# Patient Record
Sex: Female | Born: 2000 | Race: Black or African American | Hispanic: No | Marital: Single | Smoking: Never smoker
Health system: Southern US, Community
[De-identification: ages and names within clinical notes are randomized; demographics above are authoritative.]

## PROBLEM LIST (undated history)

## (undated) DIAGNOSIS — J45909 Unspecified asthma, uncomplicated: Secondary | ICD-10-CM

## (undated) HISTORY — PX: CARDIAC SURGERY: SHX584

---
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
## 2018-01-15 IMAGING — CT CT HEAD W/O CM
3 series · 14 of 47 positions shown, 16 images · non-contrast
Comparison: None.

CLINICAL DATA: Two-month history of headache. Two episodes of
recent syncope

EXAM:
CT HEAD WITHOUT CONTRAST
TECHNIQUE: Contiguous axial images were obtained from the base of the skull
through the vertex without intravenous contrast.

[Series 3: head wo 2's for pacs st · axial · 0.43mm/px · z∈[-150,-30]mm · 8 of 70 slices shown, 10 images]
[im 5/70  brain]
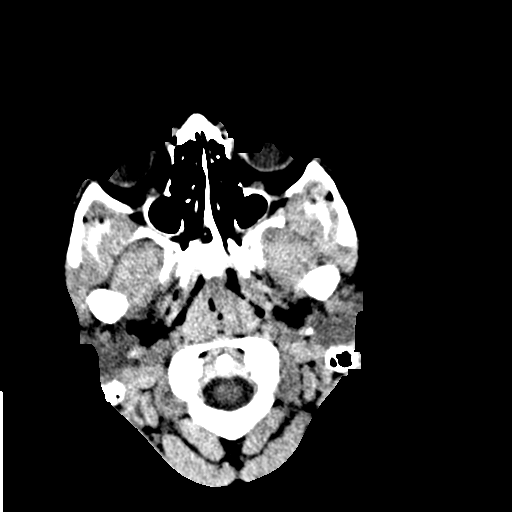
[im 5/70  bone]
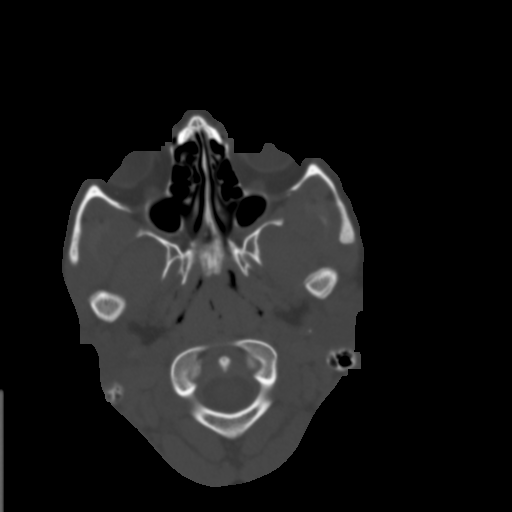
[im 15/70  brain]
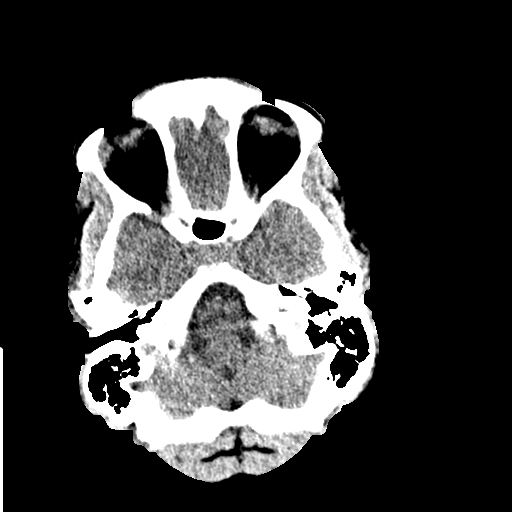
[im 22/70  brain]
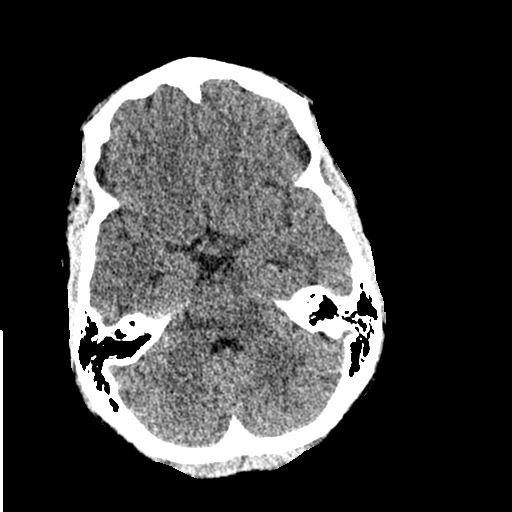
[im 31/70  brain]
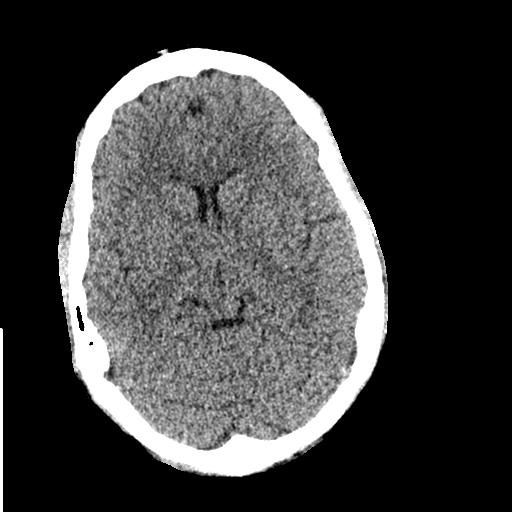
[im 39/70  brain]
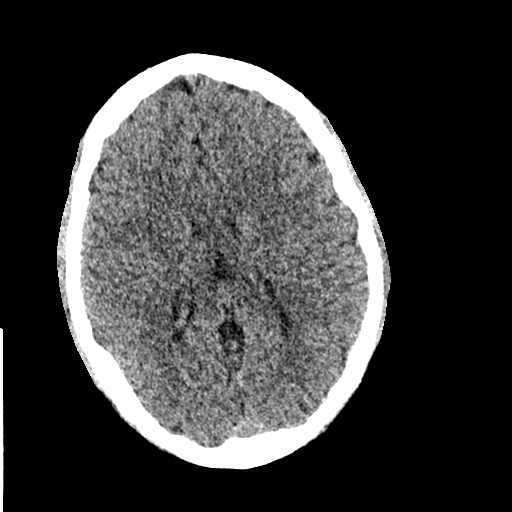
[im 39/70  bone]
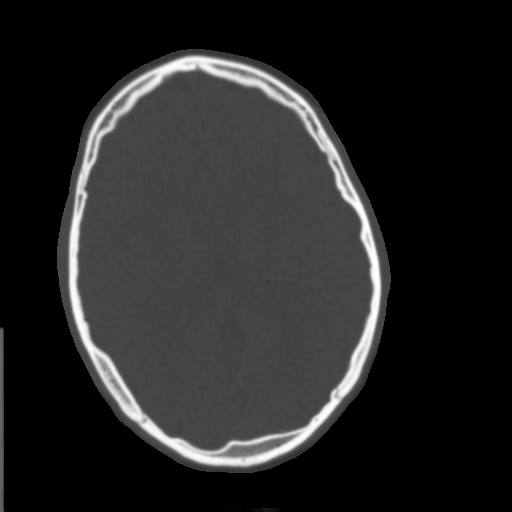
[im 48/70  brain]
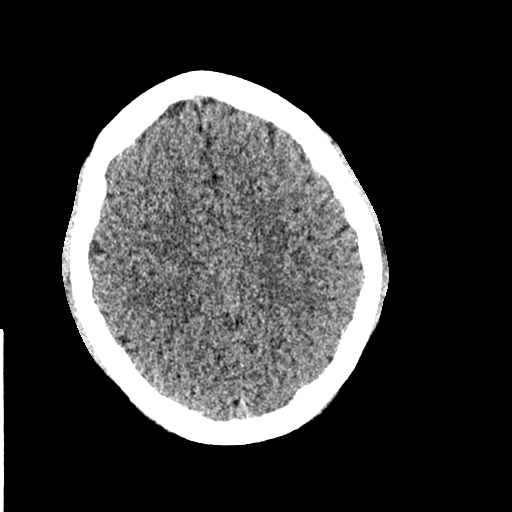
[im 55/70  brain]
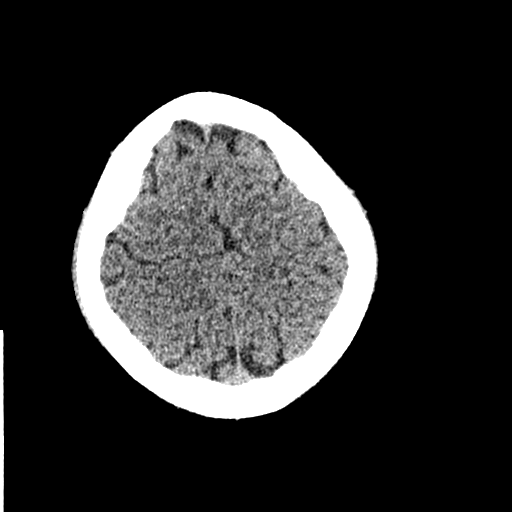
[im 65/70  brain]
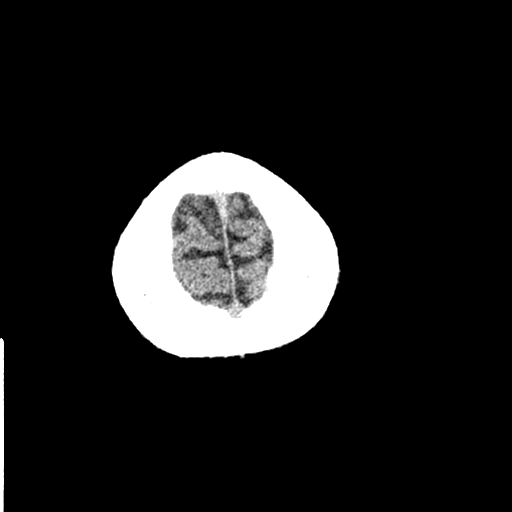

[Series 5: coronal · coronal · 0.30mm/px · 3 of 66 slices shown]
[im 22/66  brain]
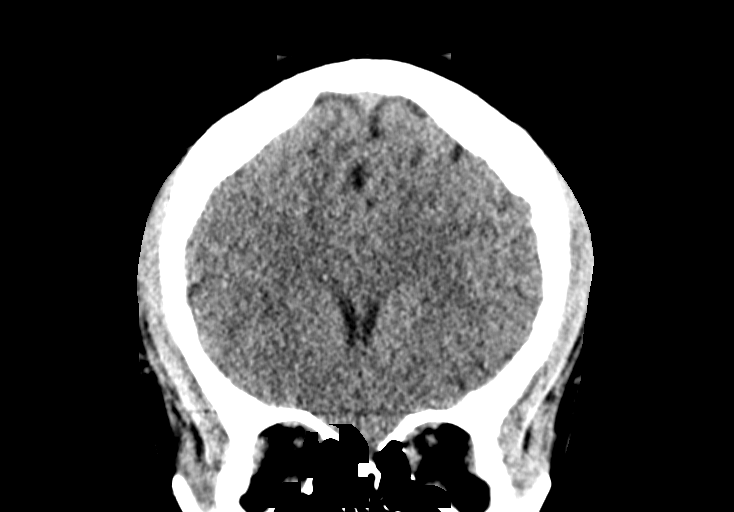
[im 29/66  brain]
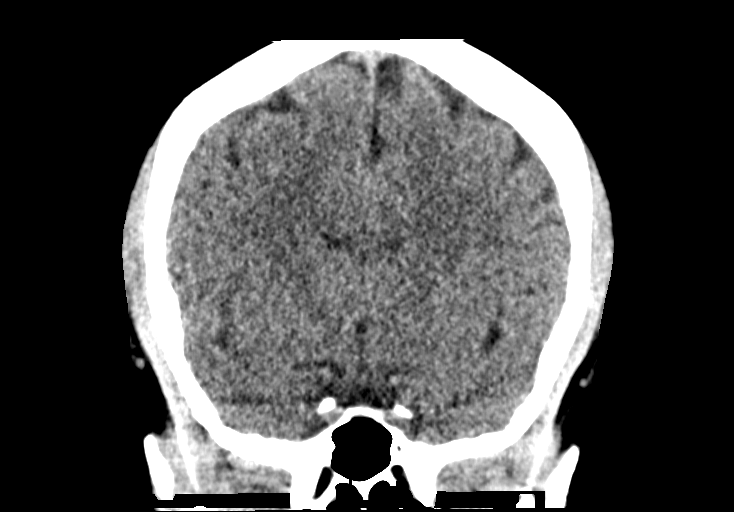
[im 37/66  brain]
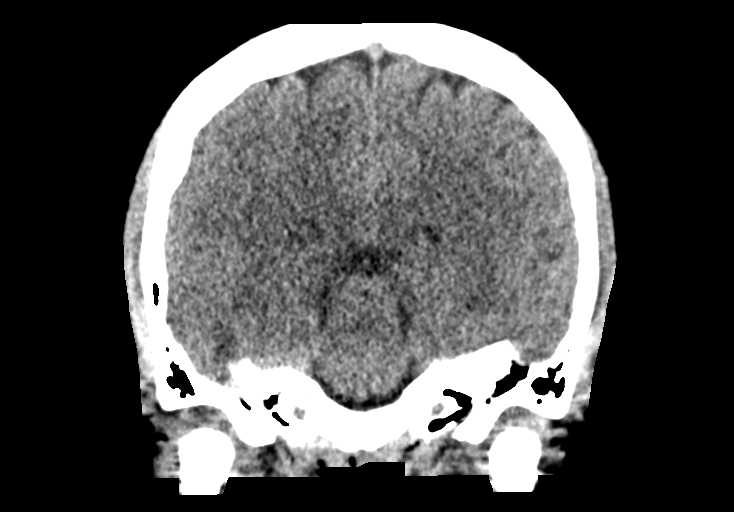

[Series 6: sagittal · sagittal · 0.30mm/px · 3 of 53 slices shown]
[im 18/53  brain]
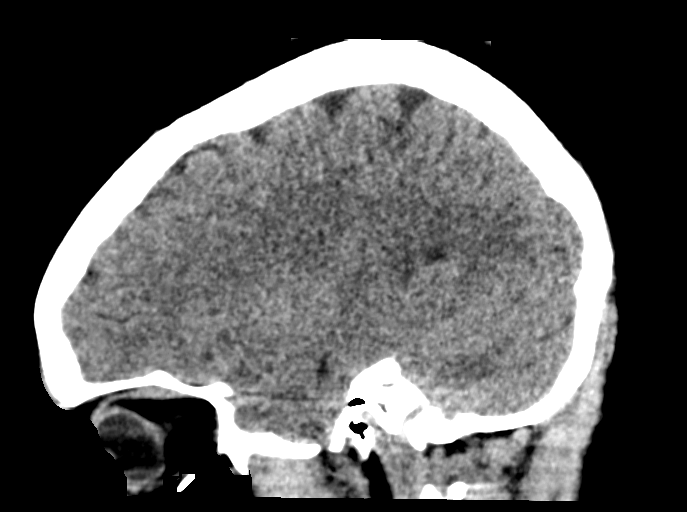
[im 27/53  brain]
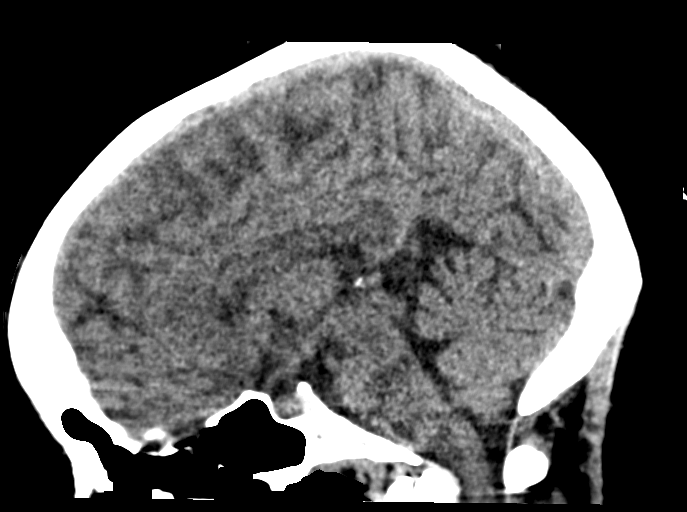
[im 35/53  brain]
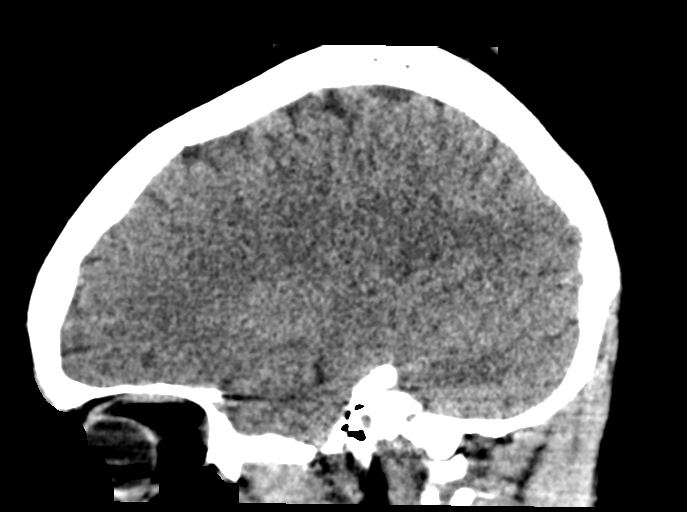

[14 of 47 positions shown; findings below may reference images not displayed]

FINDINGS: Brain: The ventricles are normal in size and configuration. There is
no intracranial mass, hemorrhage, extra-axial fluid collection, or
midline shift. Gray-white compartments appear normal. No acute
infarct evident.

Vascular: No vascular lesions are evident.

Skull: Bony calvarium appears intact.

Sinuses/Orbits: Visualized orbits appear symmetric bilaterally.
Visualized paranasal sinuses are clear.

Other: Mastoid air cells are clear.
IMPRESSION: Study within normal limits.

## 2018-09-11 ENCOUNTER — Ambulatory Visit: Payer: Self-pay | Admitting: Physician Assistant

## 2018-09-11 DIAGNOSIS — Z111 Encounter for screening for respiratory tuberculosis: Secondary | ICD-10-CM

## 2018-09-13 LAB — TB SKIN TEST
Induration: 0 mm
TB Skin Test: NEGATIVE

## 2018-10-18 ENCOUNTER — Ambulatory Visit: Payer: Self-pay | Admitting: Nurse Practitioner

## 2018-10-18 VITALS — BP 115/68 | HR 95 | Temp 98.8°F | Resp 16 | Ht 67.0 in | Wt 171.4 lb

## 2018-10-18 DIAGNOSIS — Z0000 Encounter for general adult medical examination without abnormal findings: Secondary | ICD-10-CM

## 2018-10-18 NOTE — Patient Instructions (Signed)
Health Maintenance, Female -Follow up in our office as needed.   Adopting a healthy lifestyle and getting preventive care can go a long way to promote health and wellness. Talk with your health care provider about what schedule of regular examinations is right for you. This is a good chance for you to check in with your provider about disease prevention and staying healthy. In between checkups, there are plenty of things you can do on your own. Experts have done a lot of research about which lifestyle changes and preventive measures are most likely to keep you healthy. Ask your health care provider for more information. Weight and diet Eat a healthy diet  Be sure to include plenty of vegetables, fruits, low-fat dairy products, and lean protein.  Do not eat a lot of foods high in solid fats, added sugars, or salt.  Get regular exercise. This is one of the most important things you can do for your health. ? Most adults should exercise for at least 150 minutes each week. The exercise should increase your heart rate and make you sweat (moderate-intensity exercise). ? Most adults should also do strengthening exercises at least twice a week. This is in addition to the moderate-intensity exercise. Maintain a healthy weight  Body mass index (BMI) is a measurement that can be used to identify possible weight problems. It estimates body fat based on height and weight. Your health care provider can help determine your BMI and help you achieve or maintain a healthy weight.  For females 40 years of age and older: ? A BMI below 18.5 is considered underweight. ? A BMI of 18.5 to 24.9 is normal. ? A BMI of 25 to 29.9 is considered overweight. ? A BMI of 30 and above is considered obese. Watch levels of cholesterol and blood lipids  You should start having your blood tested for lipids and cholesterol at 20 years of age, then have this test every 5 years.  You may need to have your cholesterol levels  checked more often if: ? Your lipid or cholesterol levels are high. ? You are older than 20 years of age. ? You are at high risk for heart disease. Cancer screening Lung Cancer  Lung cancer screening is recommended for adults 53-58 years old who are at high risk for lung cancer because of a history of smoking.  A yearly low-dose CT scan of the lungs is recommended for people who: ? Currently smoke. ? Have quit within the past 15 years. ? Have at least a 30-pack-year history of smoking. A pack year is smoking an average of one pack of cigarettes a day for 1 year.  Yearly screening should continue until it has been 15 years since you quit.  Yearly screening should stop if you develop a health problem that would prevent you from having lung cancer treatment. Breast Cancer  Practice breast self-awareness. This means understanding how your breasts normally appear and feel.  It also means doing regular breast self-exams. Let your health care provider know about any changes, no matter how small.  If you are in your 20s or 30s, you should have a clinical breast exam (CBE) by a health care provider every 1-3 years as part of a regular health exam.  If you are 93 or older, have a CBE every year. Also consider having a breast X-ray (mammogram) every year.  If you have a family history of breast cancer, talk to your health care provider about genetic screening.  If  you are at high risk for breast cancer, talk to your health care provider about having an MRI and a mammogram every year.  Breast cancer gene (BRCA) assessment is recommended for women who have family members with BRCA-related cancers. BRCA-related cancers include: ? Breast. ? Ovarian. ? Tubal. ? Peritoneal cancers.  Results of the assessment will determine the need for genetic counseling and BRCA1 and BRCA2 testing. Cervical Cancer Your health care provider may recommend that you be screened regularly for cancer of the pelvic  organs (ovaries, uterus, and vagina). This screening involves a pelvic examination, including checking for microscopic changes to the surface of your cervix (Pap test). You may be encouraged to have this screening done every 3 years, beginning at age 46.  For women ages 104-65, health care providers may recommend pelvic exams and Pap testing every 3 years, or they may recommend the Pap and pelvic exam, combined with testing for human papilloma virus (HPV), every 5 years. Some types of HPV increase your risk of cervical cancer. Testing for HPV may also be done on women of any age with unclear Pap test results.  Other health care providers may not recommend any screening for nonpregnant women who are considered low risk for pelvic cancer and who do not have symptoms. Ask your health care provider if a screening pelvic exam is right for you.  If you have had past treatment for cervical cancer or a condition that could lead to cancer, you need Pap tests and screening for cancer for at least 20 years after your treatment. If Pap tests have been discontinued, your risk factors (such as having a new sexual partner) need to be reassessed to determine if screening should resume. Some women have medical problems that increase the chance of getting cervical cancer. In these cases, your health care provider may recommend more frequent screening and Pap tests. Colorectal Cancer  This type of cancer can be detected and often prevented.  Routine colorectal cancer screening usually begins at 20 years of age and continues through 20 years of age.  Your health care provider may recommend screening at an earlier age if you have risk factors for colon cancer.  Your health care provider may also recommend using home test kits to check for hidden blood in the stool.  A small camera at the end of a tube can be used to examine your colon directly (sigmoidoscopy or colonoscopy). This is done to check for the earliest forms  of colorectal cancer.  Routine screening usually begins at age 67.  Direct examination of the colon should be repeated every 5-10 years through 20 years of age. However, you may need to be screened more often if early forms of precancerous polyps or small growths are found. Skin Cancer  Check your skin from head to toe regularly.  Tell your health care provider about any new moles or changes in moles, especially if there is a change in a mole's shape or color.  Also tell your health care provider if you have a mole that is larger than the size of a pencil eraser.  Always use sunscreen. Apply sunscreen liberally and repeatedly throughout the day.  Protect yourself by wearing long sleeves, pants, a wide-brimmed hat, and sunglasses whenever you are outside. Heart disease, diabetes, and high blood pressure  High blood pressure causes heart disease and increases the risk of stroke. High blood pressure is more likely to develop in: ? People who have blood pressure in the high end  of the normal range (130-139/85-89 mm Hg). ? People who are overweight or obese. ? People who are African American.  If you are 85-16 years of age, have your blood pressure checked every 3-5 years. If you are 22 years of age or older, have your blood pressure checked every year. You should have your blood pressure measured twice-once when you are at a hospital or clinic, and once when you are not at a hospital or clinic. Record the average of the two measurements. To check your blood pressure when you are not at a hospital or clinic, you can use: ? An automated blood pressure machine at a pharmacy. ? A home blood pressure monitor.  If you are between 42 years and 16 years old, ask your health care provider if you should take aspirin to prevent strokes.  Have regular diabetes screenings. This involves taking a blood sample to check your fasting blood sugar level. ? If you are at a normal weight and have a low risk for  diabetes, have this test once every three years after 20 years of age. ? If you are overweight and have a high risk for diabetes, consider being tested at a younger age or more often. Preventing infection Hepatitis B  If you have a higher risk for hepatitis B, you should be screened for this virus. You are considered at high risk for hepatitis B if: ? You were born in a country where hepatitis B is common. Ask your health care provider which countries are considered high risk. ? Your parents were born in a high-risk country, and you have not been immunized against hepatitis B (hepatitis B vaccine). ? You have HIV or AIDS. ? You use needles to inject street drugs. ? You live with someone who has hepatitis B. ? You have had sex with someone who has hepatitis B. ? You get hemodialysis treatment. ? You take certain medicines for conditions, including cancer, organ transplantation, and autoimmune conditions. Hepatitis C  Blood testing is recommended for: ? Everyone born from [birth year] through 1967. ? Anyone with known risk factors for hepatitis C. Sexually transmitted infections (STIs)  You should be screened for sexually transmitted infections (STIs) including gonorrhea and chlamydia if: ? You are sexually active and are younger than 20 years of age. ? You are older than 20 years of age and your health care provider tells you that you are at risk for this type of infection. ? Your sexual activity has changed since you were last screened and you are at an increased risk for chlamydia or gonorrhea. Ask your health care provider if you are at risk.  If you do not have HIV, but are at risk, it may be recommended that you take a prescription medicine daily to prevent HIV infection. This is called pre-exposure prophylaxis (PrEP). You are considered at risk if: ? You are sexually active and do not regularly use condoms or know the HIV status of your partner(s). ? You take drugs by injection. ? You are  sexually active with a partner who has HIV. Talk with your health care provider about whether you are at high risk of being infected with HIV. If you choose to begin PrEP, you should first be tested for HIV. You should then be tested every 3 months for as long as you are taking PrEP. Pregnancy  If you are premenopausal and you may become pregnant, ask your health care provider about preconception counseling.  If you may become pregnant, take  400 to 800 micrograms (mcg) of folic acid every day.  If you want to prevent pregnancy, talk to your health care provider about birth control (contraception). Osteoporosis and menopause  Osteoporosis is a disease in which the bones lose minerals and strength with aging. This can result in serious bone fractures. Your risk for osteoporosis can be identified using a bone density scan.  If you are 29 years of age or older, or if you are at risk for osteoporosis and fractures, ask your health care provider if you should be screened.  Ask your health care provider whether you should take a calcium or vitamin D supplement to lower your risk for osteoporosis.  Menopause may have certain physical symptoms and risks.  Hormone replacement therapy may reduce some of these symptoms and risks. Talk to your health care provider about whether hormone replacement therapy is right for you. Follow these instructions at home:  Schedule regular health, dental, and eye exams.  Stay current with your immunizations.  Do not use any tobacco products including cigarettes, chewing tobacco, or electronic cigarettes.  If you are pregnant, do not drink alcohol.  If you are breastfeeding, limit how much and how often you drink alcohol.  Limit alcohol intake to no more than 1 drink per day for nonpregnant women. One drink equals 12 ounces of beer, 5 ounces of wine, or 1 ounces of hard liquor.  Do not use street drugs.  Do not share needles.  Ask your health care  provider for help if you need support or information about quitting drugs.  Tell your health care provider if you often feel depressed.  Tell your health care provider if you have ever been abused or do not feel safe at home. This information is not intended to replace advice given to you by your health care provider. Make sure you discuss any questions you have with your health care provider. Document Released: 03/06/2011 Document Revised: 01/27/2016 Document Reviewed: 05/25/2015 Elsevier Interactive Patient Education  2019 [employer].

## 2018-10-18 NOTE — Progress Notes (Signed)
Subjective:  Desiree Hudson is a 18 y.o. female who presents for basic physical exam for work.  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 Patient denies any current health related concerns today.  The patient does have a history of asthma, but states she has not had any problems for quite some time.  Patient states she was affected most as a young child.  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  Patient informs that she is not had any complications since that time and is currently not under any cardiology care.  The patient denies any allergies to any foods or medications, and currently is not taking any medications.  The patient's last menstrual cycle started on 10/17/2018, patient states her cycles last normally 5 to 7 days, and, about every 28 to 30 days.  The patient denies any recent hospitalizations, and informs the only surgery she has had is a cardiac surgery when she was born.  The patient lives at home with her mother, stepfather, sister and brother.  Patient informs both her biological parents are healthy.  The patient's siblings are also healthy.  The patient denies the use of recreational drugs, does not smoke, and does not drink.  Patient is currently not on any contraceptives or birth control pills at this time.  Immunization History  Administered Date(s) Administered  . PPD Test 09/11/2018    Past Medical History:  Diagnosis Date  . Asthma     Past Surgical History:  Procedure Laterality Date  . CARDIAC SURGERY      Social History   Tobacco Use  . Smoking status: Never Smoker  Substance Use Topics  . Alcohol use: No  . Drug use: No    No Known Allergies  Current Outpatient Medications  Medication Sig Dispense Refill  . ibuprofen (ADVIL,MOTRIN) 200 MG tablet Take 400 mg by mouth every 6 (six) hours as needed for headache, mild pain or moderate pain.      No current facility-administered medications for this visit.     Review of Systems  Constitutional: Negative.   HENT: Negative.   Eyes: Negative.   Respiratory: Negative.   Cardiovascular: Negative.   Gastrointestinal: Negative.   Genitourinary: Negative.   Musculoskeletal: Negative.   Skin: Negative.   Neurological: Negative.      Objective:  BP 115/68 (BP Location: Right Arm, Patient Position: Sitting, Cuff Size: Normal)   Pulse 95   Temp 98.8 F (37.1 C) (Oral)   Resp 16   Ht 5\' 7"  (1.702 m)   Wt 171 lb 6.4 oz (77.7 kg)   SpO2 98%   BMI 26.85 kg/m   General Appearance:  Alert, cooperative, no distress, appears stated age  Head:  Normocephalic, without obvious abnormality, atraumatic  Eyes:  PERRL, conjunctiva/corneas clear, EOM's intact, fundi benign, both eyes  Ears:  Normal TM's and external ear canals, both ears  Nose: Nares normal, septum midline,mucosa normal, no drainage or sinus tenderness  Throat: Lips, mucosa, and tongue normal; teeth and gums normal  Neck: Supple, symmetrical, trachea midline, no adenopathy;  thyroid: not enlarged, symmetric, no tenderness/mass/nodules; no carotid bruit or JVD  Back:   Symmetric, no curvature, ROM normal, no CVA tenderness  Lungs:   Clear to auscultation bilaterally, respirations unlabored  Heart:  Regular rate and rhythm, S1 and S2 normal, no murmur, rub, or gallop  Abdomen:  Soft, non-tender, bowel sounds active all four quadrants,  no masses, no organomegaly  Extremities: Extremities normal, atraumatic, no cyanosis or edema  Pulses: 2+ and symmetric  Skin: Skin color, texture, turgor normal, no rashes or lesions  Lymph nodes: Cervical, submandibular nodes normal  Neurologic: Normal   The patient also had paperwork for a PPD test.  Patient had her last PPD in January, 2020 which was negative at that time.  Patient was provided paperwork indicating the same.  Assessment:  basic physical exam    Plan:   Exam  findings, diagnosis etiology and medication use and indications reviewed with patient. Follow- Up and discharge instructions provided. No emergent/urgent issues found on exam. Patient education was provided for health maintenance for her age group.  The patient's paperwork was completed and a copy will be scanned into her chart.  Patient verbalized understanding of information provided and agrees with plan of care (POC), all questions answered. The patient is advised to call or return to clinic if condition does not see an improvement in symptoms, or to seek the care of the closest emergency department if condition worsens with the above plan.   1. Routine physical examination  -Follow up in our office as needed.

## 2020-03-17 ENCOUNTER — Ambulatory Visit (INDEPENDENT_AMBULATORY_CARE_PROVIDER_SITE_OTHER): Payer: Medicaid Other

## 2020-03-17 ENCOUNTER — Encounter: Payer: Self-pay | Admitting: Podiatry

## 2020-03-17 ENCOUNTER — Ambulatory Visit (INDEPENDENT_AMBULATORY_CARE_PROVIDER_SITE_OTHER): Payer: Medicaid Other | Admitting: Podiatry

## 2020-03-17 ENCOUNTER — Other Ambulatory Visit: Payer: Self-pay

## 2020-03-17 DIAGNOSIS — M2012 Hallux valgus (acquired), left foot: Secondary | ICD-10-CM | POA: Diagnosis not present

## 2020-03-17 DIAGNOSIS — M21612 Bunion of left foot: Secondary | ICD-10-CM

## 2020-03-17 DIAGNOSIS — M21611 Bunion of right foot: Secondary | ICD-10-CM

## 2020-03-17 DIAGNOSIS — M21619 Bunion of unspecified foot: Secondary | ICD-10-CM

## 2020-03-17 DIAGNOSIS — M2042 Other hammer toe(s) (acquired), left foot: Secondary | ICD-10-CM

## 2020-03-17 DIAGNOSIS — M2011 Hallux valgus (acquired), right foot: Secondary | ICD-10-CM

## 2020-03-17 DIAGNOSIS — M2041 Other hammer toe(s) (acquired), right foot: Secondary | ICD-10-CM | POA: Diagnosis not present

## 2020-03-21 NOTE — Progress Notes (Signed)
   HPI: 19 y.o. female presenting today as a new patient for evaluation of bunion and hammertoe deformity.  Patient has noticed some change in the direction of her toes over the past 3 months.  This was a gradual onset.  She started to notice that her toes were curling.  She would like to have them evaluated.  She states that she has very minimal discomfort associated to the toes.  Past Medical History:  Diagnosis Date  . Asthma      Physical Exam: General: The patient is alert and oriented x3 in no acute distress.  Dermatology: Skin is warm, dry and supple bilateral lower extremities. Negative for open lesions or macerations.  Vascular: Palpable pedal pulses bilaterally. No edema or erythema noted. Capillary refill within normal limits.  Neurological: Epicritic and protective threshold grossly intact bilaterally.   Musculoskeletal Exam: Range of motion within normal limits to all pedal and ankle joints bilateral. Muscle strength 5/5 in all groups bilateral.  Clinically there is a reducible slight hammertoe deformity noted to the second digits bilateral.  No clinical evidence of a bunion deformity with a hypertrophic medial eminence however there is some isolated hallux valgus noted with an increased hallux abductus angle.  There is no pain on palpation or range of motion to these areas  Radiographic Exam:  Normal osseous mineralization. Joint spaces preserved. No fracture/dislocation/boney destruction.  Isolated valgus noted to the bilateral great toes.  The intermetatarsal space and angle appears to be within normal limits.  Assessment: 1.  Isolated hallux valgus bilateral great toes 2.  Reducible hammertoe deformity second digits bilateral   Plan of Care:  1. Patient evaluated. X-Rays reviewed.  2.  Currently the patient has mostly asymptomatic with her conditions.  I recommend continued conservative care 3.  Recommend good supportive shoes at [employer] running store 4.  I do not  recommend surgery at the moment.  They are currently asymptomatic 5.  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  Mother's name is [id number]      Felecia Shelling, DPM [facility]  Dr. Felecia Shelling, DPM    [address] [place], [place] [address]                Office [phone]  Fax [phone]
